# Patient Record
Sex: Male | Born: 1960 | Race: Black or African American | Hispanic: No | Marital: Single | Smoking: Never smoker
Health system: Southern US, Community
[De-identification: ages and names within clinical notes are randomized; demographics above are authoritative.]

## PROBLEM LIST (undated history)

## (undated) DIAGNOSIS — I10 Essential (primary) hypertension: Secondary | ICD-10-CM

## (undated) HISTORY — PX: APPENDECTOMY: SHX54

---

## 2006-02-20 ENCOUNTER — Inpatient Hospital Stay (HOSPITAL_COMMUNITY): Admission: AD | Admit: 2006-02-20 | Discharge: 2006-02-21 | Payer: Self-pay | Admitting: Interventional Cardiology

## 2006-02-20 ENCOUNTER — Encounter: Payer: Self-pay | Admitting: Emergency Medicine

## 2007-08-13 IMAGING — CT CT HEAD W/O CM
1 series · 16 of 30 positions shown, 20 images · non-contrast
Comparison: None.

CLINICAL DATA: Syncope. Left eye visual changes. 

HEAD CT WITHOUT CONTRAST
TECHNIQUE: 5mm collimated images were obtained from the base of the skull
through the vertex according to standard protocol without contrast.

[Series 2: head_seq 5.0 h45s · axial · 0.43mm/px · z∈[-639,-499]mm · 16 of 32 slices shown, 20 images]
[im 2/32  brain]
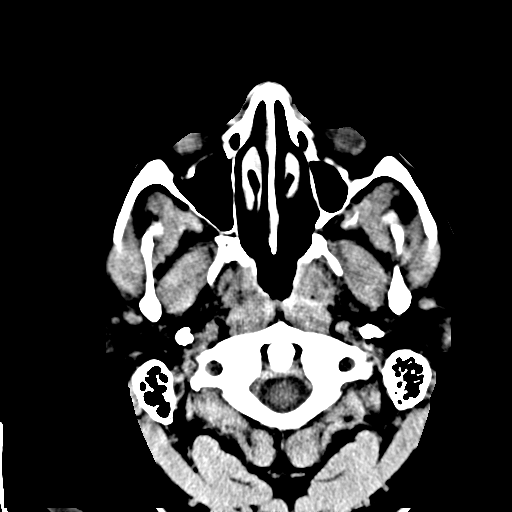
[im 2/32  bone]
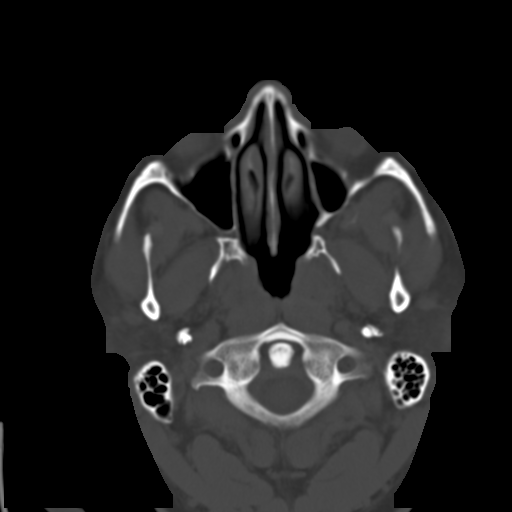
[im 4/32  brain]
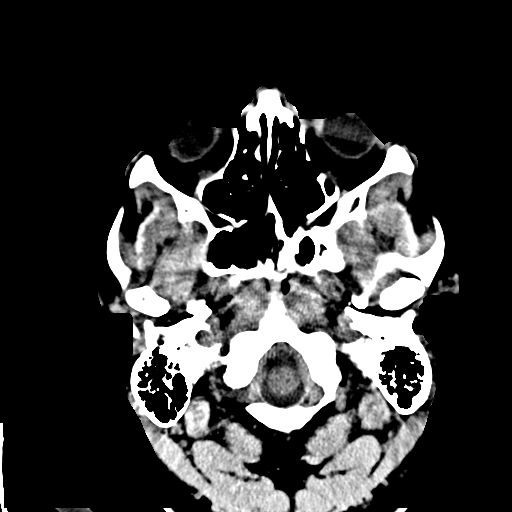
[im 6/32  brain]
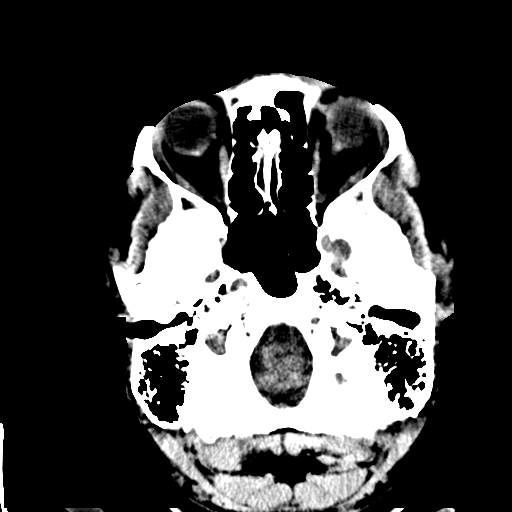
[im 8/32  brain]
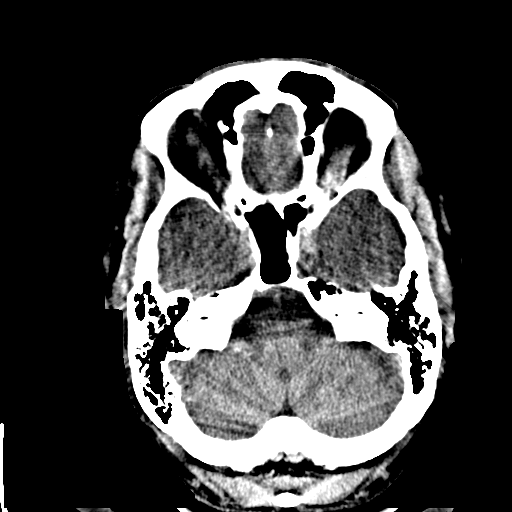
[im 9/32  brain]
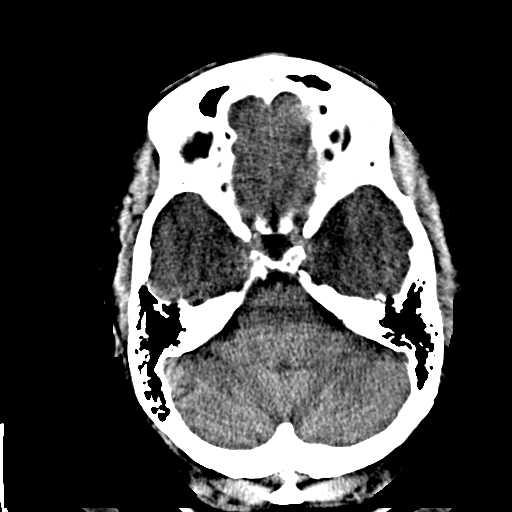
[im 9/32  bone]
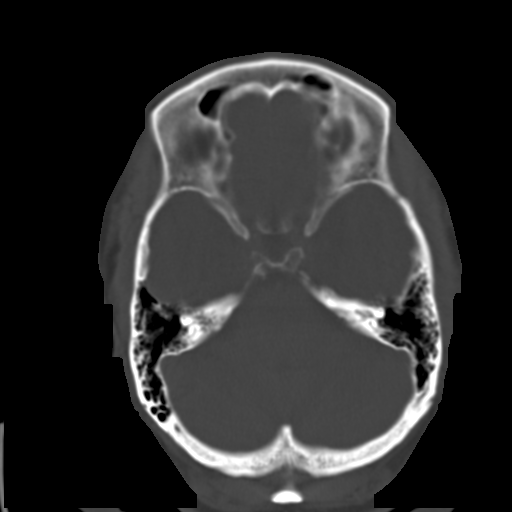
[im 11/32  brain]
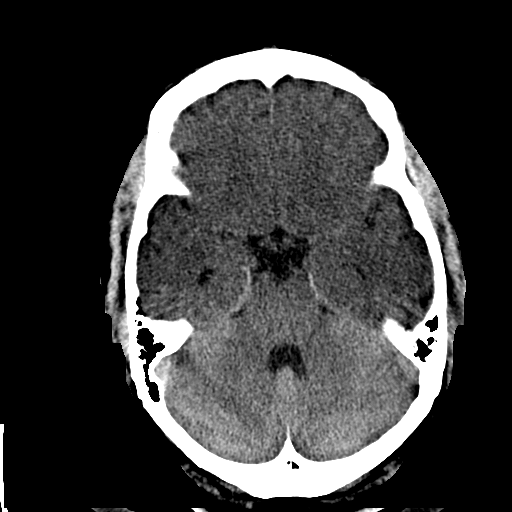
[im 13/32  brain]
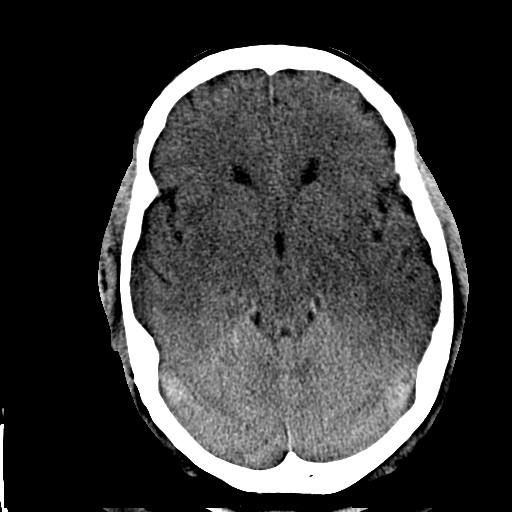
[im 15/32  brain]
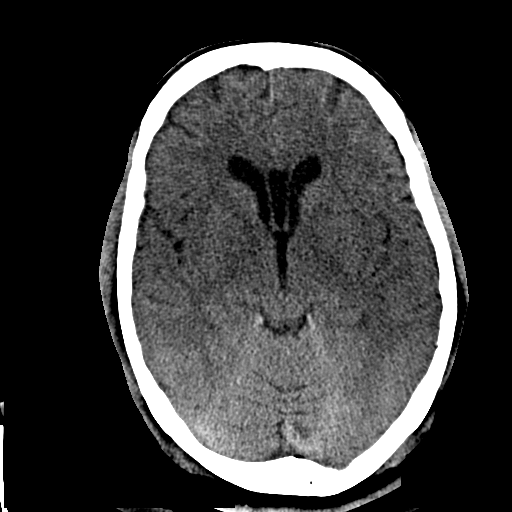
[im 17/32  brain]
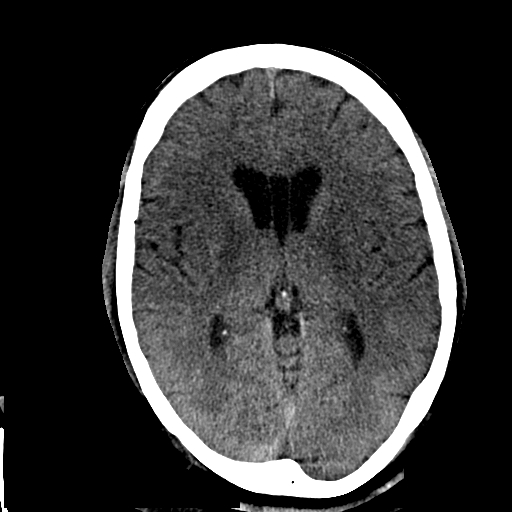
[im 17/32  bone]
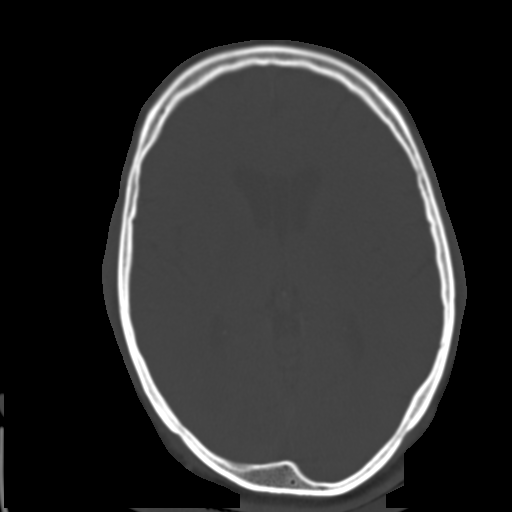
[im 19/32  brain]
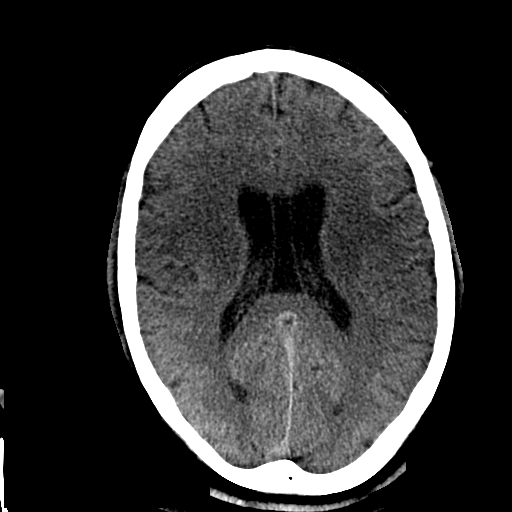
[im 21/32  brain]
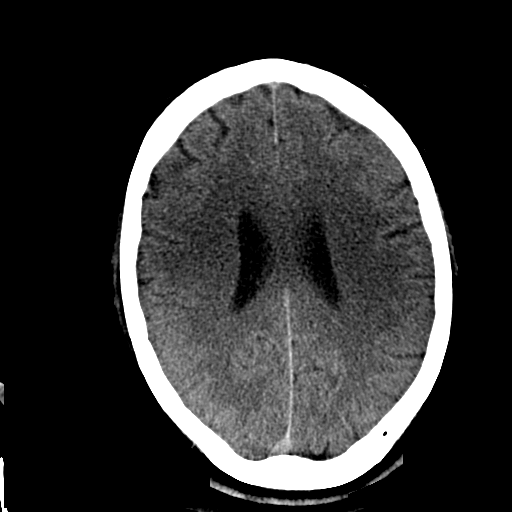
[im 23/32  brain]
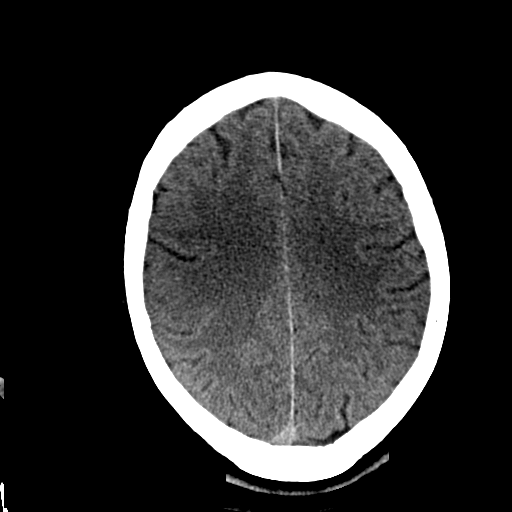
[im 24/32  brain]
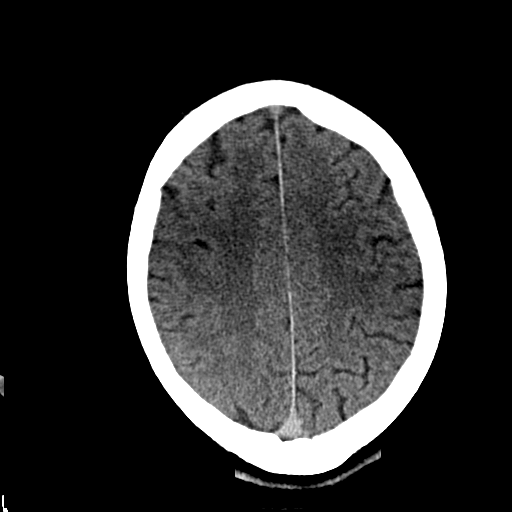
[im 24/32  bone]
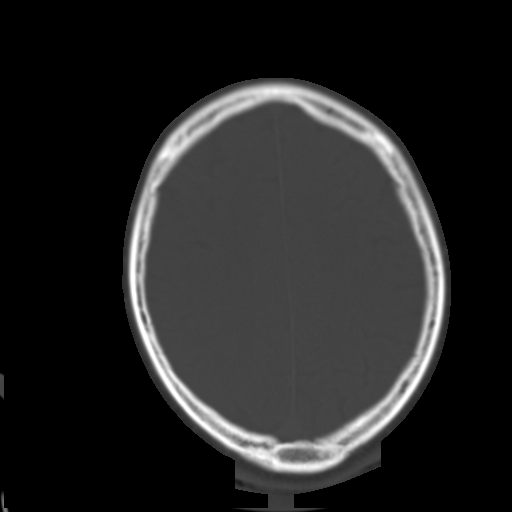
[im 26/32  brain]
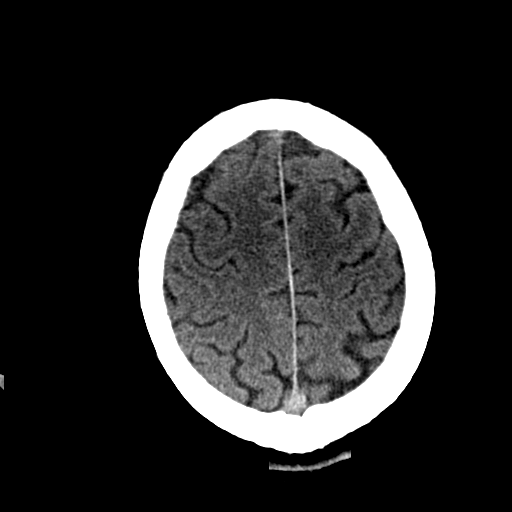
[im 28/32  brain]
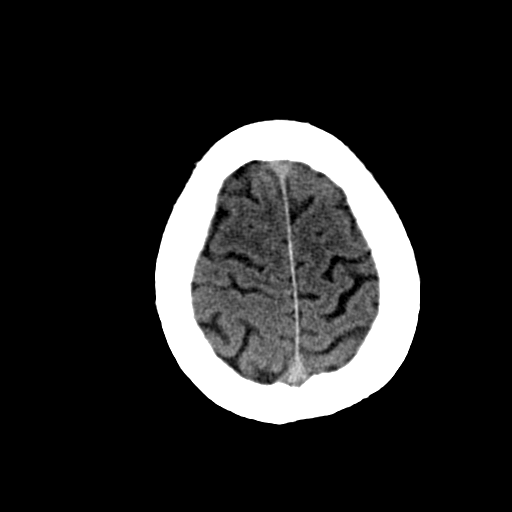
[im 30/32  brain]
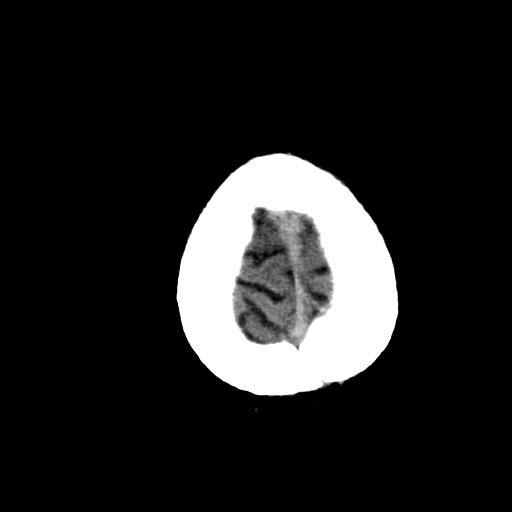

[16 of 30 positions shown; findings below may reference images not displayed]

FINDINGS: There is no evidence for acute hemorrhage, hydrocephalus,
mass/mass-effect or abnormal extra-axial fluid collection.  No definite CT
evidence for acute ischemia.  The visualized paranasal sinuses are clear

IMPRESSION

No acute intracranial abnormality.

## 2008-11-23 ENCOUNTER — Encounter: Admission: RE | Admit: 2008-11-23 | Discharge: 2008-11-23 | Payer: Self-pay | Admitting: Occupational Medicine

## 2010-11-23 NOTE — Discharge Summary (Signed)
Maurice Goodwin            ACCOUNT NO.:  [id number]   MEDICAL RECORD NO.:  [id number]          PATIENT TYPE:  INP   LOCATION:  4707                         FACILITY:  [facility]   PHYSICIAN:  Corky Crafts, MDDATE OF BIRTH:  04-18-1961   DATE OF ADMISSION:  02/20/2006  DATE OF DISCHARGE:  02/21/2006                                 DISCHARGE SUMMARY   DISCHARGE DIAGNOSES:  1. Chest pain with cardiac catheterization showing no significant coronary      artery disease.  2. Decreased ejection fraction of 45%, global hypokinesis.  3. Hypertension.  4. Hyperlipidemia.  5. Long-term medication use.  6. History of appendectomy and vasectomy.  7. Family history of  coronary artery disease.   Maurice Goodwin is a 50 year old male patient who was admitted with  syncope.  Upon presentation to the emergency room, he had central chest pain  that was relieved with IV nitroglycerin.  Ultimately, lab studies showed  negative D-dimers, CK-MBs were negative with a maximum troponin of 0.07, CBC  was normal with a platelet count of 278, BUN 13, creatinine 1.1, potassium  4.1.  EKG showed T-wave inversion in the anterior as well as inferior  lateral leads.   Ultimately, the patient underwent cardiac catheterization on February 21, 2006, and was found to have no significant coronary artery disease.  He had  global hypokinesis with an EF of 45%.  No renal artery stenosis.  The  patient was then  discharged to home later that day in stable condition.   DISCHARGE MEDICATIONS:  Include:  1. Lisinopril 20 mg a day.  2. Adalat 60 mg a day.  3. Zocor 20 mg a day.  4. Enteric-coated aspirin 325 mg a day.  5. Toprol XL 25 mg a day.   He is to follow up with Dr. Eldridge Dace on March 12, 2006, at 1:30 p.m.  Call for any recurrent pain.  Clean cath site gently with soap and water.  No driving for two days.  No lifting over 10 pounds for one week.  May  shower or bathe.  May walk up steps.  Patient  is to call for any questions  or concerns.      Guy Franco, P.A.      Corky Crafts, MD  Electronically Signed    LB/MEDQ  D:  03/17/2006  T:  03/17/2006  Job:  [id number]

## 2010-11-23 NOTE — Cardiovascular Report (Signed)
Maurice Goodwin, Maurice Goodwin            ACCOUNT NO.:  [id number]   MEDICAL RECORD NO.:  [id number]          PATIENT TYPE:  INP   LOCATION:  4707                         FACILITY:  [facility]   PHYSICIAN:  Corky Crafts, MDDATE OF BIRTH:  01-04-1961   DATE OF PROCEDURE:  02/21/2006  DATE OF DISCHARGE:  02/21/2006                              CARDIAC CATHETERIZATION   PROCEDURES PERFORMED:  1. Left heart catheterization.  2. Left ventriculogram.  3. Abdominal aortogram.  4. Coronary angiogram.   OPERATOR:  Corky Crafts, MD   INDICATIONS:  Chest pain abnormal troponin.   PROCEDURE:  The risks, benefits, and cardiac catheterization were explained  to the patient and informed consent was obtained; 1% lidocaine was  infiltrated into his right groin.  A 6-French arterial sheath was placed  into his right femoral artery using modified Seldinger technique.  Left  coronary artery angiography was performed using a JL-4.0 catheter.  Digital  angiography was performed in multiple projections using hand-injection  contrast.  A right coronary artery angiography was then performed using a JR-  4.0 catheter.  The catheter was advanced to the vessel ostium under  fluoroscopic guidance.  Digital angiography was performed in multiple  projections using hand-injection of contrast.  A left ventriculogram was  then performed using a pigtail catheter.  Power injection of contrast was  performed.  And a pullback was performed under continuous hemodynamic  pressure monitoring.  The pigtail was then withdrawn to the level of the  renal arteries and abdominal aortogram was performed.  Hemostasis will be  obtained using manual compression.   FINDINGS:  1. The left main is widely patent and angiographically normal.  2. The circumflex is a medium-sized vessel and angiographically normal.  3. The OM-1 and OM-2 are both medium-sized branching vessels which are      angiographically normal.  4. The left  anterior descending is a large vessel which is      angiographically normal.  There are 3 diagonals which are all small      vessels.  5. The right coronary artery is a large dominant vessel which is      angiographically normal.  6. The left ventriculogram reveals a mild-to-moderate global diffuse LV      hypokinesis.  The estimated ejection fraction is 45%.   HEMODYNAMICS:  LV pressure of 130/10 mmHg.  The LVEDP is 14 mmHg.  The  aortic pressure is 129/71 with a mean aortic pressure of 98 mmHg.  The  abdominal aortogram reveals single renal arteries bilaterally.  There are no  stenoses.   IMPRESSIONS:  1. No significant coronary artery.  2. Decreased left ventricular function.  3. No renal artery stenosis.   RECOMMENDATIONS:  Continue aggressive medical therapy with aspirin and ACE  inhibitor and statin.  Will add a beta blocker to his regimen for further  control of his hypertension.      Corky Crafts, MD  Electronically Signed     JSV/MEDQ  D:  02/21/2006  T:  02/22/2006  Job:  [id number]

## 2011-12-30 ENCOUNTER — Other Ambulatory Visit: Payer: Self-pay | Admitting: Occupational Medicine

## 2011-12-30 ENCOUNTER — Ambulatory Visit: Payer: Self-pay

## 2011-12-30 DIAGNOSIS — Z0000 Encounter for general adult medical examination without abnormal findings: Secondary | ICD-10-CM

## 2012-09-29 ENCOUNTER — Emergency Department (INDEPENDENT_AMBULATORY_CARE_PROVIDER_SITE_OTHER)
Admission: EM | Admit: 2012-09-29 | Discharge: 2012-09-29 | Disposition: A | Discharge status: Home / Self Care | Payer: Managed Care, Other (non HMO) | Source: Home / Self Care | Attending: Emergency Medicine | Admitting: Emergency Medicine

## 2012-09-29 ENCOUNTER — Encounter (HOSPITAL_COMMUNITY): Payer: Self-pay | Admitting: *Deleted

## 2012-09-29 DIAGNOSIS — H6120 Impacted cerumen, unspecified ear: Secondary | ICD-10-CM

## 2012-09-29 DIAGNOSIS — H6122 Impacted cerumen, left ear: Secondary | ICD-10-CM

## 2012-09-29 DIAGNOSIS — J029 Acute pharyngitis, unspecified: Secondary | ICD-10-CM

## 2012-09-29 HISTORY — DX: Essential (primary) hypertension: I10

## 2012-09-29 MED ORDER — ACETAMINOPHEN-CODEINE #3 300-30 MG PO TABS: 1.0000 | ORAL_TABLET | Freq: Four times a day (QID) | ORAL | Status: AC | PRN

## 2012-09-29 MED ORDER — CARBAMIDE PEROXIDE 6.5 % OT SOLN: 5.0000 [drp] | Freq: Two times a day (BID) | OTIC | Status: AC

## 2012-09-29 NOTE — ED Provider Notes (Signed)
History     CSN: [id number]  Arrival date & time 09/29/12  1044   First MD Initiated Contact with Patient 09/29/12 1049      Chief Complaint  Patient presents with  . Sore Throat    (Consider location/radiation/quality/duration/timing/severity/associated sxs/prior treatment) HPI Comments: Patient presents as urgent care complaining of an ongoing sore throat for about 5-6 days. Mainly in the mornings when he wakes up has some nasal congestion as well. Denies any fevers or body aches headaches or also members with similar symptoms. No cough wheezing or shortness of breath. Patient also describes that he's been drinking a lot of caffeine not sleeping well suspect his blood pressure is up because of the mild cough that he's been drinking. He also also does describe an the last 2 months he's been having sore throat frequently but have not significantly attention and symptoms have resolved on their own.  Patient is a 52 y.o. male presenting with pharyngitis. The history is provided by the patient.  Sore Throat This is a new problem. The problem occurs constantly. Pertinent negatives include no chest pain, no abdominal pain, no headaches and no shortness of breath. The symptoms are aggravated by swallowing. Nothing relieves the symptoms. He has tried nothing for the symptoms. The treatment provided no relief.    Past Medical History  Diagnosis Date  . Hypertension     History reviewed. No pertinent past surgical history.  Family History  Problem Relation Age of Onset  . Cancer Other   . Hypertension Other     History  Substance Use Topics  . Smoking status: Never Smoker   . Smokeless tobacco: Not on file  . Alcohol Use: Yes     Comment: social      Review of Systems  Constitutional: Negative for fever, chills, activity change and appetite change.  HENT: Positive for congestion and sore throat. Negative for ear pain, facial swelling, rhinorrhea, trouble swallowing, neck pain,  neck stiffness, dental problem, voice change, tinnitus and ear discharge.   Respiratory: Negative for cough and shortness of breath.   Cardiovascular: Negative for chest pain.  Gastrointestinal: Negative for abdominal pain.  Neurological: Negative for dizziness, weakness, numbness and headaches.    Allergies  Review of patient's allergies indicates no known allergies.  Home Medications   Current Outpatient Rx  Name  Route  Sig  Dispense  Refill  . carvedilol (COREG) 3.125 MG tablet   Oral   Take 3.125 mg by mouth 2 (two) times daily with a meal.         . lisinopril (PRINIVIL,ZESTRIL) 10 MG tablet   Oral   Take 10 mg by mouth daily.         Marland Kitchen acetaminophen-codeine (TYLENOL #3) 300-30 MG per tablet   Oral   Take 1-2 tablets by mouth every 6 (six) hours as needed for pain.   15 tablet   0   . carbamide peroxide (DEBROX) 6.5 % otic solution   Left Ear   Place 5 drops into the left ear 2 (two) times daily.   15 mL   0     BP 165/103  Pulse 58  Temp(Src) 98.2 F (36.8 C) (Oral)  Resp 18  SpO2 98%  Physical Exam  Nursing note and vitals reviewed. Constitutional: Vital signs are normal. He appears well-developed and well-nourished.  Non-toxic appearance. He does not have a sickly appearance. He does not appear ill. No distress.  HENT:  Head: Normocephalic.  Right Ear: Hearing,  tympanic membrane, external ear and ear canal normal.  Left Ear: Hearing, tympanic membrane, external ear and ear canal normal.  Mouth/Throat: Uvula is midline, oropharynx is clear and moist and mucous membranes are normal. No oropharyngeal exudate, posterior oropharyngeal erythema or tonsillar abscesses.  Eyes: Conjunctivae are normal. Pupils are equal, round, and reactive to light. No scleral icterus.  Neck: Neck supple.  Cardiovascular: Normal rate.  Exam reveals no gallop and no friction rub.   No murmur heard. Pulmonary/Chest: Effort normal and breath sounds normal. He has no decreased  breath sounds.  Skin: Skin is warm. No erythema.    ED Course  Procedures (including critical care time)  Labs Reviewed  POCT RAPID STREP A ([facility] ONLY)   No results found.   1. Throat soreness   2. Cerumen impaction, left       MDM  Pharynx exam was unremarkable. Patient had a negative strep test. No lymphadenopathies no coexistent symptoms to suspect a infectious process. Mild dryness of oral mucosa noted have encouraged patient to use a humidifier at night to take Tylenol No. 3 for discomfort or pain. Return if worsening symptoms swelling or fevers as noted.  Have also discussed with patient to followup with primary care Dr. for blood pressure control and recheck. Instructed patient to decrease his caffeine consumption. Both patient and wife present during exam and discharge instructions both agree with treatment plan and followup care as necessary.        Jimmie Molly, MD 09/29/12 [phone]

## 2012-09-29 NOTE — ED Notes (Signed)
Pt reports sore throat for the past 4 days with hx of recurring sore throat

## 2014-02-07 ENCOUNTER — Emergency Department (INDEPENDENT_AMBULATORY_CARE_PROVIDER_SITE_OTHER)
Admission: EM | Admit: 2014-02-07 | Discharge: 2014-02-07 | Disposition: A | Discharge status: Home / Self Care | Source: Home / Self Care | Attending: Emergency Medicine | Admitting: Emergency Medicine

## 2014-02-07 ENCOUNTER — Encounter (HOSPITAL_COMMUNITY): Payer: Self-pay | Admitting: Emergency Medicine

## 2014-02-07 DIAGNOSIS — IMO0002 Reserved for concepts with insufficient information to code with codable children: Secondary | ICD-10-CM

## 2014-02-07 DIAGNOSIS — S8391XA Sprain of unspecified site of right knee, initial encounter: Secondary | ICD-10-CM

## 2014-02-07 MED ORDER — HYDROCODONE-ACETAMINOPHEN 5-325 MG PO TABS: 1.0000 | ORAL_TABLET | Freq: Four times a day (QID) | ORAL | Status: DC | PRN

## 2014-02-07 NOTE — Discharge Instructions (Signed)
Knee Bracing  Knee braces are supports to help stabilize and protect an injured or painful knee. They come in many different styles. They should support and protect the knee without increasing the chance of other injuries to yourself or others. It is important not to have a false sense of security when using a brace. Knee braces that help you to keep using your knee:  · Do not restore normal knee stability under high stress forces.  · May decrease some aspects of athletic performance.  Some of the different types of knee braces are:  · Prophylactic knee braces are designed to prevent or reduce the severity of knee injuries during sports that make injury to the knee more likely.  · Rehabilitative knee braces are designed to allow protected motion of:  ¨ Injured knees.  ¨ Knees that have been treated with or without surgery.  There is no evidence that the use of a supportive knee brace protects the graft following a successful anterior cruciate ligament (ACL) reconstruction. However, braces are sometimes used to:   · Protect injured ligaments.  · Control knee movement during the initial healing period.  They may be used as part of the treatment program for the various injured ligaments or cartilage of the knee including the:  · Anterior cruciate ligament.  · Medial collateral ligament.  · Medial or lateral cartilage (meniscus).  · Posterior cruciate ligament.  · Lateral collateral ligament.  Rehabilitative knee braces are most commonly used:  · During crutch-assisted walking right after injury.  · During crutch-assisted walking right after surgery to repair the cartilage and/or cruciate ligament injury.  · For a short period of time, 2-8 weeks, after the injury or surgery.  The value of a rehabilitative brace as opposed to a cast or splint includes the:  · Ability to adjust the brace for swelling.  · Ability to remove the brace for examinations, icing, or showering.  · Ability to allow for movement in a controlled  range of motion.  Functional knee braces give support to knees that have already been injured. They are designed to provide stability for the injured knee and provide protection after repair. Functional knee braces may not affect performance much. Lower extremity muscle strengthening, flexibility, and improvement in technique are more important than bracing in treating ligamentous knee injuries. Functional braces are not a substitute for rehabilitation or surgical procedures.  Unloader/off-loader braces are designed to provide pain relief in arthritic knees. Patients with wear and tear arthritis from growing old or from an old cartilage injury (osteoarthritis) of the knee, and bowlegged (varus) or knock-knee (valgus) deformities, often develop increased pain in the arthritic side due to increased loading. Unloader/off-loader braces are made to reduce uneven loading in such knees. There is reduction in bowing out movement in bowlegged knees when the correct unloader brace is used. Patients with advanced osteoarthritis or severe varus or valgus alignment problems would not likely benefit from bracing.  Patellofemoral braces help the kneecap to move smoothly and well centered over the end of the femur in the knee.   Most people who wear knee braces feel that they help. However, there is a lack of scientific evidence that knee braces are helpful at the level needed for athletic participation to prevent injury. In spite of this, athletes report an increase in knee stability, pain relief, performance improvement, and confidence during athletics when using a brace.   Different knee problems require different knee braces:  · Your caregiver may suggest one   also need one for pain in the front of your knee that is not getting better with strengthening and  flexibility exercises. °Get your caregiver's advice if you want to try a knee brace. The caregiver will advise you on where to get them and provide a prescription when it is needed to fashion and/or fit the brace. °Knee braces are the least important part of preventing knee injuries or getting better following injury. Stretching, strengthening and technique improvement are far more important in caring for and preventing knee injuries. When strengthening your knee, increase your activities a little at a time so as not to develop injuries from overuse. Work out an exercise plan with your caregiver and/or physical therapist to get the best program for you. Do not let a knee brace become a crutch. °Always remember, there are no braces which support the knee as well as your original ligaments and cartilage you were born with. Conditioning, proper warm-up, and stretching remain the most important parts of keeping your knees healthy. °HOW TO USE A KNEE BRACE °· During sports, knee braces should be used as directed by your caregiver. °· Make sure that the hinges are where the knee bends. °· Straps, tapes, or hook-and-loop tapes should be fastened around your leg as instructed. °· You should check the placement of the brace during activities to make sure that it has not moved. Poorly positioned braces can hurt rather than help you. °· To work well, a knee brace should be worn during all activities that put you at risk of knee injury. °· Warm up properly before beginning athletic activities. °HOME CARE INSTRUCTIONS °· Knee braces often get damaged during normal use. Replace worn-out braces for maximum benefit. °· Clean regularly with soap and water. °· Inspect your brace often for wear and tear. °· Cover exposed metal to protect others from injury. °· Durable materials may cost more, but last longer. °SEEK IMMEDIATE MEDICAL CARE IF:  °· Your knee seems to be getting worse rather than better. °· You have increasing pain or  swelling in the knee. °· You have problems caused by the knee brace. °· You have increased swelling or inflammation (redness or soreness) in your knee. °· Your knee becomes warm and more painful and you develop an unexplained temperature over 101°F (38.3°C). °MAKE SURE YOU:  °· Understand these instructions. °· Will watch your condition. °· Will get help right away if you are not doing well or get worse. °See your caregiver, physical therapist, or orthopedic surgeon for additional information. °Document Released: 09/14/2003 Document Revised: 11/08/2013 Document Reviewed: 12/21/2008 °[employer]® Patient Information ©2015 [employer]. This information is not intended to replace advice given to you by your health care provider. Make sure you discuss any questions you have with your health care provider. ° °Knee Pain °The knee is the complex joint between your thigh and your lower leg. It is made up of bones, tendons, ligaments, and cartilage. The bones that make up the knee are: °· The femur in the thigh. °· The tibia and fibula in the lower leg. °· The patella or kneecap riding in the groove on the lower femur. °CAUSES  °Knee pain is a common complaint with many causes. A few of these causes are: °· Injury, such as: °¨ A ruptured ligament or tendon injury. °¨ Torn cartilage. °· Medical conditions, such as: °¨ Gout °¨ Arthritis °¨ Infections °· Overuse, over training, or overdoing a physical activity. °Knee pain can be minor or severe. Knee pain can   accompany debilitating injury. Minor knee problems often respond well to self-care measures or get well on their own. More serious injuries may need medical intervention or even surgery. °SYMPTOMS °The knee is complex. Symptoms of knee problems can vary widely. Some of the problems are: °· Pain with movement and weight bearing. °· Swelling and tenderness. °· Buckling of the knee. °· Inability to straighten or extend your knee. °· Your knee locks and you cannot straighten  it. °· Warmth and redness with pain and fever. °· Deformity or dislocation of the kneecap. °DIAGNOSIS  °Determining what is wrong may be very straight forward such as when there is an injury. It can also be challenging because of the complexity of the knee. Tests to make a diagnosis may include: °· Your caregiver taking a history and doing a physical exam. °· Routine X-rays can be used to rule out other problems. X-rays will not reveal a cartilage tear. Some injuries of the knee can be diagnosed by: °¨ Arthroscopy a surgical technique by which a small video camera is inserted through tiny incisions on the sides of the knee. This procedure is used to examine and repair internal knee joint problems. Tiny instruments can be used during arthroscopy to repair the torn knee cartilage (meniscus). °¨ Arthrography is a radiology technique. A contrast liquid is directly injected into the knee joint. Internal structures of the knee joint then become visible on X-ray film. °¨ An MRI scan is a non X-ray radiology procedure in which magnetic fields and a computer produce two- or three-dimensional images of the inside of the knee. Cartilage tears are often visible using an MRI scanner. MRI scans have largely replaced arthrography in diagnosing cartilage tears of the knee. °· Blood work. °· Examination of the fluid that helps to lubricate the knee joint (synovial fluid). This is done by taking a sample out using a needle and a syringe. °TREATMENT °The treatment of knee problems depends on the cause. Some of these treatments are: °· Depending on the injury, proper casting, splinting, surgery, or physical therapy care will be needed. °· Give yourself adequate recovery time. Do not overuse your joints. If you begin to get sore during workout routines, back off. Slow down or do fewer repetitions. °· For repetitive activities such as cycling or running, maintain your strength and nutrition. °· Alternate muscle groups. For example, if  you are a weight lifter, work the upper body on one day and the lower body the next. °· Either tight or weak muscles do not give the proper support for your knee. Tight or weak muscles do not absorb the stress placed on the knee joint. Keep the muscles surrounding the knee strong. °· Take care of mechanical problems. °¨ If you have flat feet, orthotics or special shoes may help. See your caregiver if you need help. °¨ Arch supports, sometimes with wedges on the inner or outer aspect of the heel, can help. These can shift pressure away from the side of the knee most bothered by osteoarthritis. °¨ A brace called an "unloader" brace also may be used to help ease the pressure on the most arthritic side of the knee. °· If your caregiver has prescribed crutches, braces, wraps or ice, use as directed. The acronym for this is PRICE. This means protection, rest, ice, compression, and elevation. °· Nonsteroidal anti-inflammatory drugs (NSAIDs), can help relieve pain. But if taken immediately after an injury, they may actually increase swelling. Take NSAIDs with food in your stomach. Stop them   if you develop stomach problems. Do not take these if you have a history of ulcers, stomach pain, or bleeding from the bowel. Do not take without your caregiver's approval if you have problems with fluid retention, heart failure, or kidney problems.  For ongoing knee problems, physical therapy may be helpful.  Glucosamine and chondroitin are over-the-counter dietary supplements. Both may help relieve the pain of osteoarthritis in the knee. These medicines are different from the usual anti-inflammatory drugs. Glucosamine may decrease the rate of cartilage destruction.  Injections of a corticosteroid drug into your knee joint may help reduce the symptoms of an arthritis flare-up. They may provide pain relief that lasts a few months. You may have to wait a few months between injections. The injections do have a small increased risk of  infection, water retention, and elevated blood sugar levels.  Hyaluronic acid injected into damaged joints may ease pain and provide lubrication. These injections may work by reducing inflammation. A series of shots may give relief for as long as 6 months.  Topical painkillers. Applying certain ointments to your skin may help relieve the pain and stiffness of osteoarthritis. Ask your pharmacist for suggestions. Many over the-counter products are approved for temporary relief of arthritis pain.  In some countries, doctors often prescribe topical NSAIDs for relief of chronic conditions such as arthritis and tendinitis. A review of treatment with NSAID creams found that they worked as well as oral medications but without the serious side effects. PREVENTION  Maintain a healthy weight. Extra pounds put more strain on your joints.  Get strong, stay limber. Weak muscles are a common cause of knee injuries. Stretching is important. Include flexibility exercises in your workouts.  Be smart about exercise. If you have osteoarthritis, chronic knee pain or recurring injuries, you may need to change the way you exercise. This does not mean you have to stop being active. If your knees ache after jogging or playing basketball, consider switching to swimming, water aerobics, or other low-impact activities, at least for a few days a week. Sometimes limiting high-impact activities will provide relief.  Make sure your shoes fit well. Choose footwear that is right for your sport.  Protect your knees. Use the proper gear for knee-sensitive activities. Use kneepads when playing volleyball or laying carpet. Buckle your seat belt every time you drive. Most shattered kneecaps occur in car accidents.  Rest when you are tired. SEEK MEDICAL CARE IF:  You have knee pain that is continual and does not seem to be getting better.  SEEK IMMEDIATE MEDICAL CARE IF:  Your knee joint feels hot to the touch and you have a high  fever. MAKE SURE YOU:   Understand these instructions.  Will watch your condition.  Will get help right away if you are not doing well or get worse. Document Released: 04/21/2007 Document Revised: 09/16/2011 Document Reviewed: 04/21/2007 [facility] Patient Information 2015 [place] [place]. This information is not intended to replace advice given to you by your health care provider. Make sure you discuss any questions you have with your health care provider.  Ligament Sprain Ligaments are tough, fibrous tissues that hold bones together at the joints. A sprain can occur when a ligament is stretched. This injury may take several weeks to heal. HOME CARE INSTRUCTIONS   Rest the injured area for as long as directed by your caregiver. Then slowly start using the joint as directed by your caregiver and as the pain allows.  Keep the affected joint raised if  possible to lessen swelling.  Apply ice for 15-20 minutes to the injured area every couple hours for the first half day, then 03-04 times per day for the first 48 hours. Put the ice in a plastic bag and place a towel between the bag of ice and your skin.  Wear any splinting, casting, or elastic bandage applications as instructed.  Only take over-the-counter or prescription medicines for pain, discomfort, or fever as directed by your caregiver. Do not use aspirin immediately after the injury unless instructed by your caregiver. Aspirin can cause increased bleeding and bruising of the tissues.  If you were given crutches, continue to use them as instructed and do not resume weight bearing on the affected extremity until instructed. SEEK MEDICAL CARE IF:   Your bruising, swelling, or pain increases.  You have cold and numb fingers or toes if your arm or leg was injured. SEEK IMMEDIATE MEDICAL CARE IF:   Your toes are numb or blue if your leg was injured.  Your fingers are numb or blue if your arm was injured.  Your pain is not responding  to medicines and continues to stay the same or gets worse. MAKE SURE YOU:   Understand these instructions.  Will watch your condition.  Will get help right away if you are not doing well or get worse. Document Released: 06/21/2000 Document Revised: 09/16/2011 Document Reviewed: 04/19/2008 [facility] Patient Information 2015 [place], [place]. This information is not intended to replace advice given to you by your health care provider. Make sure you discuss any questions you have with your health care provider.

## 2014-02-07 NOTE — ED Notes (Signed)
Ice pack to affected area; fitted w knee immobilizer, crutches

## 2014-02-07 NOTE — ED Provider Notes (Signed)
CSN: [id number]     Arrival date & time 02/07/14  0919 History   First MD Initiated Contact with Patient 02/07/14 [phone]     Chief Complaint  Patient presents with  . Knee Injury   (Consider location/radiation/quality/duration/timing/severity/associated sxs/prior Treatment) HPI Comments: States he was in a chair yesterday evening, leaned forward and chair tipped, causing him to hyperextend right leg. Felt and heard something "pop" at medial area of right knee and felt pain in area of same. States he had continued pain overnight despite applying heat, BioFreeze and taking Klonopin and Aleve. Denies previous injury or surgery. States knee is not painful unless he tries to bear weight on right leg.  PCP: [facility]  The history is provided by the patient.    Past Medical History  Diagnosis Date  . Hypertension    Past Surgical History  Procedure Laterality Date  . Appendectomy     Family History  Problem Relation Age of Onset  . Cancer Other   . Hypertension Other    History  Substance Use Topics  . Smoking status: Never Smoker   . Smokeless tobacco: Not on file  . Alcohol Use: Yes     Comment: social    Review of Systems  All other systems reviewed and are negative.   Allergies  Review of patient's allergies indicates no known allergies.  Home Medications   Prior to Admission medications   Medication Sig Start Date End Date Taking? Authorizing Provider  carvedilol (COREG) 3.125 MG tablet Take 3.125 mg by mouth 2 (two) times daily with a meal.   Yes Historical Provider, MD  lisinopril (PRINIVIL,ZESTRIL) 10 MG tablet Take 10 mg by mouth daily.   Yes Historical Provider, MD  acetaminophen-codeine (TYLENOL #3) 300-30 MG per tablet Take 1-2 tablets by mouth every 6 (six) hours as needed for pain. 09/29/12   Jimmie Molly, MD  HYDROcodone-acetaminophen (NORCO/VICODIN) 5-325 MG per tablet Take 1 tablet by mouth every 6 (six) hours as needed for moderate pain or severe  pain. 02/07/14   Jess Barters Akela Pocius, PA   BP 170/83  Pulse 60  Temp(Src) 98.2 F (36.8 C) (Oral)  Resp 18  Ht 5' 11.75" (1.822 m)  Wt 258 lb (117.028 kg)  BMI 35.25 kg/m2  SpO2 95% Physical Exam  Nursing note and vitals reviewed. Constitutional: He is oriented to person, place, and time. He appears well-developed and well-nourished. No distress.  HENT:  Head: Normocephalic and atraumatic.  Eyes: Conjunctivae are normal. No scleral icterus.  Cardiovascular: Normal rate.   Pulmonary/Chest: Effort normal.  Musculoskeletal:       Right knee: He exhibits normal range of motion, no swelling, no effusion, no ecchymosis, no deformity, no laceration, no erythema, normal alignment, no LCL laxity, normal patellar mobility, no bony tenderness, normal meniscus and no MCL laxity. Tenderness found. Medial joint line tenderness noted. No lateral joint line, no MCL, no LCL and no patellar tendon tenderness noted.  Neurological: He is alert and oriented to person, place, and time.  Skin: Skin is warm and dry. No rash noted. No erythema.  Psychiatric: He has a normal mood and affect. His behavior is normal.    ED Course  Procedures (including critical care time) Labs Review Labs Reviewed - No data to display  Imaging Review No results found.   MDM   1. Right knee sprain, initial encounter    Patient offered pain medication at [facility] and he politely declined. Place in knee immobilizer and provided crutches.  Suspect minor injury to either medial meniscus of MCL strain. No joint laxity or deformity. Advised limited weight bearing over next 7-10 days, ice, elevation, pain management and knee immobilizer during day. Encourage orthopedic follow up if no improvement with above therapies. [facility]     Jess BartersJennifer Lee ByronPresson, [place]PA 02/07/14 1023

## 2014-02-07 NOTE — ED Notes (Addendum)
States he was in chair, leaned forward and chair tipped, causing him to hyperextend right leg. Felt and heard something "pop" c/o did not sleep much last PM due to pain

## 2014-02-09 NOTE — ED Provider Notes (Signed)
Medical screening examination/treatment/procedure(s) were performed by non-physician practitioner and as supervising physician I was immediately available for consultation/collaboration.  Leslee Homeavid Carys Malina, M.D.  Reuben Likesavid C Kiandria Clum, MD 02/09/14 [phone]

## 2014-11-01 ENCOUNTER — Emergency Department (HOSPITAL_COMMUNITY)
Admission: EM | Admit: 2014-11-01 | Discharge: 2014-11-01 | Disposition: A | Discharge status: Home / Self Care | Attending: Emergency Medicine | Admitting: Emergency Medicine

## 2014-11-01 ENCOUNTER — Encounter (HOSPITAL_COMMUNITY): Payer: Self-pay | Admitting: Emergency Medicine

## 2014-11-01 DIAGNOSIS — G8929 Other chronic pain: Secondary | ICD-10-CM | POA: Diagnosis not present

## 2014-11-01 DIAGNOSIS — I10 Essential (primary) hypertension: Secondary | ICD-10-CM | POA: Diagnosis not present

## 2014-11-01 DIAGNOSIS — M5432 Sciatica, left side: Secondary | ICD-10-CM | POA: Diagnosis not present

## 2014-11-01 DIAGNOSIS — Z79899 Other long term (current) drug therapy: Secondary | ICD-10-CM | POA: Diagnosis not present

## 2014-11-01 DIAGNOSIS — M545 Low back pain: Secondary | ICD-10-CM | POA: Diagnosis present

## 2014-11-01 MED ORDER — PREDNISONE 20 MG PO TABS: ORAL_TABLET | ORAL | Status: DC

## 2014-11-01 MED ORDER — HYDROMORPHONE HCL 1 MG/ML IJ SOLN
1.0000 mg | Freq: Once | INTRAMUSCULAR | Status: AC
  Administered 2014-11-01: 1 mg via INTRAMUSCULAR
  Filled 2014-11-01: qty 1

## 2014-11-01 MED ORDER — HYDROCODONE-ACETAMINOPHEN 5-325 MG PO TABS: 1.0000 | ORAL_TABLET | Freq: Four times a day (QID) | ORAL | Status: AC | PRN

## 2014-11-01 MED ORDER — METHOCARBAMOL 500 MG PO TABS: 500.0000 mg | ORAL_TABLET | Freq: Two times a day (BID) | ORAL | Status: AC

## 2014-11-01 NOTE — Discharge Instructions (Signed)
Sciatica Sciatica is pain, weakness, numbness, or tingling along the path of the sciatic nerve. The nerve starts in the lower back and runs down the back of each leg. The nerve controls the muscles in the lower leg and in the back of the knee, while also providing sensation to the back of the thigh, lower leg, and the sole of your foot. Sciatica is a symptom of another medical condition. For instance, nerve damage or certain conditions, such as a herniated disk or bone spur on the spine, pinch or put pressure on the sciatic nerve. This causes the pain, weakness, or other sensations normally associated with sciatica. Generally, sciatica only affects one side of the body. CAUSES   Herniated or slipped disc.  Degenerative disk disease.  A pain disorder involving the narrow muscle in the buttocks (piriformis syndrome).  Pelvic injury or fracture.  Pregnancy.  Tumor (rare). SYMPTOMS  Symptoms can vary from mild to very severe. The symptoms usually travel from the low back to the buttocks and down the back of the leg. Symptoms can include:  Mild tingling or dull aches in the lower back, leg, or hip.  Numbness in the back of the calf or sole of the foot.  Burning sensations in the lower back, leg, or hip.  Sharp pains in the lower back, leg, or hip.  Leg weakness.  Severe back pain inhibiting movement. These symptoms may get worse with coughing, sneezing, laughing, or prolonged sitting or standing. Also, being overweight may worsen symptoms. DIAGNOSIS  Your caregiver will perform a physical exam to look for common symptoms of sciatica. He or she may ask you to do certain movements or activities that would trigger sciatic nerve pain. Other tests may be performed to find the cause of the sciatica. These may include:  Blood tests.  X-rays.  Imaging tests, such as an MRI or CT scan. TREATMENT  Treatment is directed at the cause of the sciatic pain. Sometimes, treatment is not necessary  and the pain and discomfort goes away on its own. If treatment is needed, your caregiver may suggest:  Over-the-counter medicines to relieve pain.  Prescription medicines, such as anti-inflammatory medicine, muscle relaxants, or narcotics.  Applying heat or ice to the painful area.  Steroid injections to lessen pain, irritation, and inflammation around the nerve.  Reducing activity during periods of pain.  Exercising and stretching to strengthen your abdomen and improve flexibility of your spine. Your caregiver may suggest losing weight if the extra weight makes the back pain worse.  Physical therapy.  Surgery to eliminate what is pressing or pinching the nerve, such as a bone spur or part of a herniated disk. HOME CARE INSTRUCTIONS   Only take over-the-counter or prescription medicines for pain or discomfort as directed by your caregiver.  Apply ice to the affected area for 20 minutes, 3-4 times a day for the first 48-72 hours. Then try heat in the same way.  Exercise, stretch, or perform your usual activities if these do not aggravate your pain.  Attend physical therapy sessions as directed by your caregiver.  Keep all follow-up appointments as directed by your caregiver.  Do not wear high heels or shoes that do not provide proper support.  Check your mattress to see if it is too soft. A firm mattress may lessen your pain and discomfort. SEEK IMMEDIATE MEDICAL CARE IF:   You lose control of your bowel or bladder (incontinence).  You have increasing weakness in the lower back, pelvis, buttocks,   or legs.  You have redness or swelling of your back.  You have a burning sensation when you urinate.  You have pain that gets worse when you lie down or awakens you at night.  Your pain is worse than you have experienced in the past.  Your pain is lasting longer than 4 weeks.  You are suddenly losing weight without reason. MAKE SURE YOU:  Understand these  instructions.  Will watch your condition.  Will get help right away if you are not doing well or get worse. Document Released: 06/18/2001 Document Revised: 12/24/2011 Document Reviewed: 11/03/2011 [employer] Patient Information 2015 [employer]. This information is not intended to replace advice given to you by your health care provider. Make sure you discuss any questions you have with your health care provider.  

## 2014-11-01 NOTE — ED Notes (Signed)
Back pain with Hx of same. Left side back pain today occurred after twisting motion, then shooting pains on left leg. Ambulatory with difficulty at triage. Unable to sit. NAD

## 2014-11-01 NOTE — ED Provider Notes (Signed)
CSN: [id number]     Arrival date & time 11/01/14  1650 History  This chart was scribed for non-physician practitioner, Fayrene HelperBowie Liany Mumpower, PA-C working with Richardean Canalavid H Yao, MD by Gwenyth Oberatherine Macek, ED scribe. This patient was seen in room TR05C/TR05C and the patient's care was started at 5:26 PM   Chief Complaint  Patient presents with  . Back Pain   The history is provided by the patient. No language interpreter was used.   HPI Comments: Maurice Goodwin is a 54 y.o. male with a history of chronic, intermittent right-sided lower back pain who presents to the Emergency Department complaining of constant, moderate, 8/10, throbbing, left-sided groin pain that started 3 days ago and became worse today. He states pain began to radiate to his left knee after he twisted earlier this morning. Pt reports pain becomes worse with sitting and improves with standing. He has tried Clonazepam with no relief. [facility] but has not been evaluated by an orthopedist or neurologist for chronic pain. He denies a history of falls or injuries, but his wife states he helped someone to move furniture within the last 3 days. Pt denies a history of cancer and IV drug use. He also denies fever, chill, hematuria, rash, bladder incontinence and bowel incontinence or saddle anesthesia as associated symptoms.   Past Medical History  Diagnosis Date  . Hypertension    Past Surgical History  Procedure Laterality Date  . Appendectomy     Family History  Problem Relation Age of Onset  . Cancer Other   . Hypertension Other    History  Substance Use Topics  . Smoking status: Never Smoker   . Smokeless tobacco: Not on file  . Alcohol Use: Yes     Comment: social    Review of Systems  Constitutional: Negative for fever and chills.  Genitourinary: Negative for hematuria.  Musculoskeletal: Positive for back pain.  Skin: Negative for rash.    Allergies  Review of patient's allergies indicates no known  allergies.  Home Medications   Prior to Admission medications   Medication Sig Start Date End Date Taking? Authorizing Provider  acetaminophen-codeine (TYLENOL #3) 300-30 MG per tablet Take 1-2 tablets by mouth every 6 (six) hours as needed for pain. 09/29/12   Jimmie MollyPaolo Coll, MD  carvedilol (COREG) 3.125 MG tablet Take 3.125 mg by mouth 2 (two) times daily with a meal.    Historical Provider, MD  HYDROcodone-acetaminophen (NORCO/VICODIN) 5-325 MG per tablet Take 1 tablet by mouth every 6 (six) hours as needed for moderate pain or severe pain. 02/07/14   Mathis FareJennifer Lee H Presson, PA  lisinopril (PRINIVIL,ZESTRIL) 10 MG tablet Take 10 mg by mouth daily.    Historical Provider, MD   BP 141/91 mmHg  Pulse 55  Temp(Src) 97.9 F (36.6 C) (Oral)  Resp 16  SpO2 99% Physical Exam  Constitutional: He appears well-developed and well-nourished. No distress.  HENT:  Head: Normocephalic and atraumatic.  Eyes: Conjunctivae and EOM are normal.  Neck: Neck supple. No tracheal deviation present.  Cardiovascular: Normal rate, regular rhythm and intact distal pulses.   Pulmonary/Chest: Effort normal. No respiratory distress.  Abdominal: Soft. There is no tenderness.  Musculoskeletal: He exhibits no tenderness (L knee with FROM, nontender).  No significant midline spine tenderness, no crepitus, no step-offs; no CVA tenderness; patellar reflex intact; DP pulse palpable; brisk cap refill to toes; positive straight leg raise to left side  Skin: Skin is warm and dry.  Psychiatric: He  has a normal mood and affect. His behavior is normal.  Nursing note and vitals reviewed.   ED Course  Procedures   DIAGNOSTIC STUDIES: Oxygen Saturation is 99% on RA, normal by my interpretation.    COORDINATION OF CARE: 5:32 PM pt with radicular LLE pain consistent with sciatica.  No red flags.  Able to ambulate. NVI.  Will not obtain x-ray because no obvious injury or deformity. Will refer to neurology for follow-up and  elective MRI, if deemed necessary. Discussed treatment plan with pt and his wife which includes Robaxin, Prednisone and Vicodin. He agreed to plan.  Labs Review Labs Reviewed - No data to display  Imaging Review No results found.   EKG Interpretation None      MDM   Final diagnoses:  Sciatica, left    BP 141/91 mmHg  Pulse 55  Temp(Src) 97.9 F (36.6 C) (Oral)  Resp 16  SpO2 99%   I personally performed the services described in this documentation, which was scribed in my presence. The recorded information has been reviewed and is accurate.    Fayrene Helper, PA-C 11/01/14 1747  Richardean Canal, MD 11/01/14 [phone]

## 2016-12-31 ENCOUNTER — Emergency Department (HOSPITAL_COMMUNITY)
Admission: EM | Admit: 2016-12-31 | Discharge: 2016-12-31 | Disposition: A | Discharge status: Home / Self Care | Payer: No Typology Code available for payment source | Attending: Emergency Medicine | Admitting: Emergency Medicine

## 2016-12-31 ENCOUNTER — Emergency Department (HOSPITAL_COMMUNITY): Payer: No Typology Code available for payment source

## 2016-12-31 DIAGNOSIS — R079 Chest pain, unspecified: Secondary | ICD-10-CM | POA: Diagnosis not present

## 2016-12-31 DIAGNOSIS — S3991XA Unspecified injury of abdomen, initial encounter: Secondary | ICD-10-CM | POA: Diagnosis present

## 2016-12-31 DIAGNOSIS — I10 Essential (primary) hypertension: Secondary | ICD-10-CM | POA: Insufficient documentation

## 2016-12-31 DIAGNOSIS — Y939 Activity, unspecified: Secondary | ICD-10-CM | POA: Diagnosis not present

## 2016-12-31 DIAGNOSIS — S30811A Abrasion of abdominal wall, initial encounter: Secondary | ICD-10-CM | POA: Diagnosis not present

## 2016-12-31 DIAGNOSIS — Y999 Unspecified external cause status: Secondary | ICD-10-CM | POA: Diagnosis not present

## 2016-12-31 DIAGNOSIS — Y92410 Unspecified street and highway as the place of occurrence of the external cause: Secondary | ICD-10-CM | POA: Insufficient documentation

## 2016-12-31 DIAGNOSIS — S40212A Abrasion of left shoulder, initial encounter: Secondary | ICD-10-CM | POA: Diagnosis not present

## 2016-12-31 DIAGNOSIS — Z79899 Other long term (current) drug therapy: Secondary | ICD-10-CM | POA: Diagnosis not present

## 2016-12-31 LAB — CBC WITH DIFFERENTIAL/PLATELET
BASOS ABS: 0 10*3/uL (ref 0.0–0.1)
BASOS PCT: 0 %
Eosinophils Absolute: 0.2 10*3/uL (ref 0.0–0.7)
Eosinophils Relative: 2 %
HEMATOCRIT: 49.3 % (ref 39.0–52.0)
HEMOGLOBIN: 16.7 g/dL (ref 13.0–17.0)
Lymphocytes Relative: 22 %
Lymphs Abs: 1.8 10*3/uL (ref 0.7–4.0)
MCH: 27.3 pg (ref 26.0–34.0)
MCHC: 33.9 g/dL (ref 30.0–36.0)
MCV: 80.7 fL (ref 78.0–100.0)
Monocytes Absolute: 0.8 10*3/uL (ref 0.1–1.0)
Monocytes Relative: 10 %
NEUTROS ABS: 5.3 10*3/uL (ref 1.7–7.7)
NEUTROS PCT: 66 %
Platelets: 233 10*3/uL (ref 150–400)
RBC: 6.11 MIL/uL — ABNORMAL HIGH (ref 4.22–5.81)
RDW: 14 % (ref 11.5–15.5)
WBC: 8.1 10*3/uL (ref 4.0–10.5)

## 2016-12-31 LAB — COMPREHENSIVE METABOLIC PANEL
ALBUMIN: 4.2 g/dL (ref 3.5–5.0)
ALK PHOS: 53 U/L (ref 38–126)
ALT: 33 U/L (ref 17–63)
AST: 30 U/L (ref 15–41)
Anion gap: 7 (ref 5–15)
BILIRUBIN TOTAL: 0.7 mg/dL (ref 0.3–1.2)
BUN: 10 mg/dL (ref 6–20)
CO2: 25 mmol/L (ref 22–32)
Calcium: 10 mg/dL (ref 8.9–10.3)
Chloride: 104 mmol/L (ref 101–111)
Creatinine, Ser: 0.94 mg/dL (ref 0.61–1.24)
GFR calc Af Amer: >60 mL/min (ref >60)
GFR calc non Af Amer: >60 mL/min (ref >60)
GLUCOSE: 113 mg/dL — AB (ref 65–99)
POTASSIUM: 4.2 mmol/L (ref 3.5–5.1)
SODIUM: 136 mmol/L (ref 135–145)
TOTAL PROTEIN: 7.1 g/dL (ref 6.5–8.1)

## 2016-12-31 LAB — URINALYSIS, ROUTINE W REFLEX MICROSCOPIC
Bilirubin Urine: NEGATIVE
GLUCOSE, UA: NEGATIVE mg/dL
HGB URINE DIPSTICK: NEGATIVE
Ketones, ur: NEGATIVE mg/dL
Leukocytes, UA: NEGATIVE
Nitrite: NEGATIVE
PH: 7 (ref 5.0–8.0)
PROTEIN: NEGATIVE mg/dL
Specific Gravity, Urine: 1.011 (ref 1.005–1.030)

## 2016-12-31 MED ORDER — IBUPROFEN 800 MG PO TABS
800.0000 mg | ORAL_TABLET | Freq: Once | ORAL | Status: AC
  Administered 2016-12-31: 800 mg via ORAL
  Filled 2016-12-31: qty 1

## 2016-12-31 MED ORDER — OXYCODONE HCL 5 MG PO TABS
5.0000 mg | ORAL_TABLET | Freq: Once | ORAL | Status: AC
  Administered 2016-12-31: 5 mg via ORAL
  Filled 2016-12-31: qty 1

## 2016-12-31 MED ORDER — OXYCODONE HCL 5 MG PO TABS: 5.0000 mg | ORAL_TABLET | ORAL | 0 refills | Status: AC | PRN

## 2016-12-31 MED ORDER — ACETAMINOPHEN 500 MG PO TABS
1000.0000 mg | ORAL_TABLET | Freq: Once | ORAL | Status: AC
  Administered 2016-12-31: 1000 mg via ORAL
  Filled 2016-12-31: qty 2
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
## 2016-12-31 NOTE — ED Notes (Signed)
Pt verbalized understanding of discharge instructions. Pt unable to sign due to computer issue.

## 2016-12-31 NOTE — ED Notes (Signed)
Pt ambulated to the restroom without difficulty. Gait even and steady.

## 2016-12-31 NOTE — Discharge Instructions (Signed)
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
